# Patient Record
Sex: Male | Born: 2005
Health system: Southern US, Community
[De-identification: ages and names within clinical notes are randomized; demographics above are authoritative.]

## PROBLEM LIST (undated history)

## (undated) DIAGNOSIS — F419 Anxiety disorder, unspecified: Secondary | ICD-10-CM

## (undated) DIAGNOSIS — F909 Attention-deficit hyperactivity disorder, unspecified type: Secondary | ICD-10-CM

## (undated) DIAGNOSIS — H5210 Myopia, unspecified eye: Secondary | ICD-10-CM

## (undated) DIAGNOSIS — IMO0001 Reserved for inherently not codable concepts without codable children: Secondary | ICD-10-CM

## (undated) HISTORY — DX: Attention-deficit hyperactivity disorder, unspecified type: F90.9

## (undated) HISTORY — DX: Myopia, unspecified eye: H52.10

## (undated) HISTORY — DX: Anxiety disorder, unspecified: F41.9

## (undated) HISTORY — DX: Reserved for inherently not codable concepts without codable children: IMO0001

---
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
## 2023-12-03 ENCOUNTER — Ambulatory Visit (HOSPITAL_COMMUNITY): Admission: EM | Admit: 2023-12-03 | Discharge: 2023-12-03 | Disposition: A | Discharge status: Home / Self Care

## 2023-12-03 DIAGNOSIS — F332 Major depressive disorder, recurrent severe without psychotic features: Secondary | ICD-10-CM | POA: Diagnosis not present

## 2023-12-03 NOTE — ED Provider Notes (Addendum)
 Behavioral Health Urgent Care Medical Screening Exam  Patient Name: Caleb Wells MRN: [id number] Date of Evaluation: 12/03/23 Chief Complaint:  Worsening depressive symptoms & intrusive thoughts. Diagnosis:  Final diagnoses:  MDD (major depressive disorder), recurrent severe, without psychosis (HCC)   History of Present illness: Caleb Wells is a 18 y.o. male with a prior mental health diagnoses of MDD who works in to the [employer] accompanied by his parents with complaints of worsening depressive symptoms as well as intrusive thoughts, requesting a mental health evaluation.  Assessment: Patient acknowledges that he has been diagnosed with MDD in the past, reports that he has also had intrusive thoughts, stemming back when he was in elementary school.  He reports that lately, he has been having some religious preoccupation, that he has not been able to get rid of.  Patient reports that he is "brain's interpretation of sensory information has been off".  He perseverates about human existence and death, stating things such as "I am feeling fragile that that can happen at any time, I want to be able to find ways to reverse the aging process, and extend human life, so that humans can control the universe, so I can keep involving forever, I want to love my family forever."  Patient talks about being raised [employer], and currently having difficulty coming to terms with the fact that God exists, and states that he has been questioning why God would allow people to suffer.  He talks about he also does not want to live life not believing anything.  He talks about his fear about death being eminent, and feeling frustrated that he cannot do anything about it, he talks about feeling as though humans are fragile, and that humans are like any other life on earth, amongst other thoughts that he recognizes as being irrational, but is not able to get rid of.  Patient  perseverates with what seems almost like intrusive thoughts related to OCD.  Patient also presents with some depressive symptoms, reports anhedonia, not enjoying things that make him happy lately, decreased energy levels, poor concentration levels at school, a poor appetite lately, psychomotor retardation, as well as poor motivation, and feelings of hopelessness, which have worsened in the past 2 weeks.  He reports that he typically enjoys eating, but has not been feeling like eating lately.  He describes symptoms also significant for GAD; reports worrying all the time, feeling on edge with muscle tension, reports restlessness, denies panic attacks recently, but states that he has had them in the past.  Patient reports being on sertraline, states it is a medication that he took when he was younger in elementary school when he was first diagnosed with depression.  He reports that he stopped taking it for a while, was prescribed it again 1 year ago, but just began consistently taking it 1 week ago, but states that it is not helping.  Patient educated on the need for consistency with SSRI type medications, and also on the fact that this medication type reaches therapeutic levels in 4 to 6 weeks, and verbalizes understanding.  He reports that this medication is being prescribed by his primary care provider.  Patient reports that he does not have a mental health provider currently.  Stopped attending therapy a few months ago because it was not productive.  Patient denies any past suicide attempts, denies self-injurious behaviors, denies mental health related hospitalizations, denies any history of auditory, visual, or tactile hallucinations in the past, recently,  or currently.  Patient denies paranoia in the past or recently, harbors delusional and intrusive thoughts as noted above.  He denies any current suicidal ideations, states that he is afraid of death, and would never do anything to harm himself.  Denies  intent or plan to harm himself.  He reports having a good support system at home where he resides with his grandparents.  He reports that he was brought today by his parents, but they reside separate from each other and are separated.  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 Denies substance use.   Patient is being referred to [facility], and has been educated that the program will contact him in the next few days. Educated  to call [phone], 911, go to the nearest ER if symptoms worsen or he starts feeling suicidal. Verbalizes understanding. Left hospital in no acute distress.  Flowsheet Row ED from 12/03/2023 in [facility]  C-SSRS RISK CATEGORY No Risk       Psychiatric Specialty Exam  Presentation  General Appearance:Appropriate for Environment  Eye Contact:Good  Speech:Clear and Coherent  Speech Volume:Normal  Handedness:Right   Mood and Affect  Mood:Depressed; Anxious  Affect:Congruent   Thought Process  Thought Processes:Coherent  Descriptions of Associations:Intact  Orientation:Full (Time, Place and Person)  Thought Content:WDL    Hallucinations:None  Ideas of Reference:Delusions  Suicidal Thoughts:No  Homicidal Thoughts:No   Sensorium  Memory:Immediate Fair  Judgment:Fair  Insight:Fair   Executive Functions  Concentration:Fair  Attention Span:Fair  Recall:Fair  Fund of Knowledge:Fair  Language:Fair   Psychomotor Activity  Psychomotor Activity:Normal   Assets  Assets:Resilience   Sleep  Sleep:Fair  Number of hours: No data recorded  Physical Exam: Physical Exam Vitals and nursing note reviewed.    Review of Systems  Psychiatric/Behavioral:  Positive for depression (Denies SI/HI, denies intent or plan). Negative for hallucinations, memory loss and suicidal ideas. The patient is nervous/anxious. The patient does not have insomnia.   All other systems reviewed and are  negative.  Blood pressure 136/78, pulse 65, temperature 98.2 F (36.8 C), temperature source Oral, resp. rate 18, SpO2 99%. There is no height or weight on file to calculate BMI.  Musculoskeletal: Strength & Muscle Tone: within normal limits Gait & Station: normal Patient leans: N/A   [facility] MSE Discharge Disposition for Follow up and Recommendations: Based on my evaluation the patient does not appear to have an emergency medical condition and can be discharged with resources and follow up care in outpatient services for Partial Hospitalization Program -Referral sent for PCP program.  Starleen Blue, NP 12/03/2023, 7:08 PM

## 2023-12-03 NOTE — Discharge Planning (Signed)
 This Clinical research associate obtained collateral information from mother and father with the consent of the patient. Pt currently lives with maternal grandparents since returning home from college. Parents report diagnosis of Asperger's ASD, GAD, MDD and ADHD. Parents report that pt is extremely intellectual and currently taking college classes but reports feeling sad and overwhelmed at times. Requesting outpatient resources. Parents deny safety concerns with pt coming home. No history of self harm or suicide attempts. No access to weapons or firearms in the home. Discussed with both patient and parents the option if PHP or IOP programs to help him engage socially and emotionally. Pt is agreeable to this treatment plan and referral was sent out to the [facility]. Safety planning was completed with parents and patient as follows:   Safety Plan Caleb Wells will reach out to his parents, call 911 or call mobile crisis, or go to nearest emergency room if condition worsens or if suicidal thoughts become active. Patient will follow up with the Partial Hospitalization Program for outpatient psychiatric services (therapy/medication management).  The suicide prevention education provided includes the following: Suicide risk factors Suicide prevention and interventions National Suicide Hotline telephone number [facility] assessment telephone number [facility] Emergency Assistance 911 [facility] and/or Residential Mobile Crisis Unit telephone number Request made of family/significant other to:  parents and grandparents Remove weapons (e.g., guns, rifles, knives), all items previously/currently identified as safety concern.   Remove drugs/medications (over the counter, prescriptions, illicit drugs), all items previously/currently identified as a safety concern.

## 2023-12-03 NOTE — Progress Notes (Signed)
   12/03/23 1721  [facility] Triage Screening (Walk-ins at [facility] only)  How Did You Hear About [place]? Family/Friend  What Is the Reason for Your Visit/Call Today? Caleb Wells presents to [facility] voluntarily accompanied by his parents. Pt states that he has been extremely depressed. Pt states that his emotions have been all over and can only feel dispair. Pt states that his parents suggested that he come here and get evaluated. Pt states that he felt so dispair today that he didn't want to eat even though he had food that he picked. Pt denies SI, HI, AVH and alcohol/drug use.  How Long Has This Been Causing You Problems? 1 wk - 1 month  Have You Recently Had Any Thoughts About Hurting Yourself? No  Are You Planning to Commit Suicide/Harm Yourself At This time? No  Have you Recently Had Thoughts About Hurting Someone [name]? No  Are You Planning To Harm Someone At This Time? No  Physical Abuse Denies  Verbal Abuse Denies  Sexual Abuse Denies  Exploitation of patient/patient's resources Denies  Self-Neglect Denies  Are you currently experiencing any auditory, visual or other hallucinations? No  Have You Used Any Alcohol or Drugs in the Past 24 Hours? No  Do you have any current medical co-morbidities that require immediate attention? No  Clinician description of patient physical appearance/behavior: groomed, rocking back & forth, cooperative  What Do You Feel Would Help You the Most Today? Treatment for Depression or other mood problem;Social Support  If access to [facility] was not available, would you have sought care in the Emergency Department? No  Determination of Need Routine (7 days)  Options For Referral Medication Management;Outpatient Therapy;Intensive Outpatient Therapy;Inpatient Hospitalization

## 2023-12-03 NOTE — Discharge Instructions (Signed)
 If you start having SI/HI, or any other emergency or your thoughts, please go to the nearest ER, come back here, call [phone], or 911.

## 2023-12-03 NOTE — ED Notes (Signed)
 Patient Is discharging at this time. Printed AVS reviewed with patient along with resources. Patient denies SI, HI, and A/V/H. No valuables/belongings. No s/s of current distress.

## 2023-12-04 ENCOUNTER — Telehealth (HOSPITAL_COMMUNITY): Payer: Self-pay | Admitting: Professional

## 2023-12-07 ENCOUNTER — Telehealth (HOSPITAL_COMMUNITY): Payer: Self-pay | Admitting: Professional

## 2024-01-11 ENCOUNTER — Ambulatory Visit (HOSPITAL_COMMUNITY): Admitting: Family
